# Patient Record
Sex: Male | Born: 1980 | Race: White | Hispanic: No | Marital: Married | Smoking: Current some day smoker
Health system: Southern US, Community
[De-identification: ages and names within clinical notes are randomized; demographics above are authoritative.]

## PROBLEM LIST (undated history)

## (undated) DIAGNOSIS — R569 Unspecified convulsions: Secondary | ICD-10-CM

## (undated) HISTORY — PX: TONSILLECTOMY: SUR1361

## (undated) HISTORY — PX: CHOLECYSTECTOMY: SHX55

---

## 2019-04-01 ENCOUNTER — Other Ambulatory Visit: Payer: Self-pay | Admitting: Neurology

## 2019-04-01 DIAGNOSIS — R569 Unspecified convulsions: Secondary | ICD-10-CM

## 2019-04-04 ENCOUNTER — Other Ambulatory Visit: Payer: Self-pay

## 2019-04-04 ENCOUNTER — Emergency Department
Admission: EM | Admit: 2019-04-04 | Discharge: 2019-04-04 | Disposition: A | Discharge status: Home / Self Care | Payer: 59 | Attending: Emergency Medicine | Admitting: Emergency Medicine

## 2019-04-04 ENCOUNTER — Emergency Department: Payer: 59

## 2019-04-04 DIAGNOSIS — F17210 Nicotine dependence, cigarettes, uncomplicated: Secondary | ICD-10-CM | POA: Insufficient documentation

## 2019-04-04 DIAGNOSIS — R569 Unspecified convulsions: Secondary | ICD-10-CM | POA: Diagnosis present

## 2019-04-04 HISTORY — DX: Unspecified convulsions: R56.9

## 2019-04-04 LAB — CBC
HCT: 44.5 % (ref 39.0–52.0)
Hemoglobin: 14.9 g/dL (ref 13.0–17.0)
MCH: 28.5 pg (ref 26.0–34.0)
MCHC: 33.5 g/dL (ref 30.0–36.0)
MCV: 85.2 fL (ref 80.0–100.0)
Platelets: 253 10*3/uL (ref 150–400)
RBC: 5.22 MIL/uL (ref 4.22–5.81)
RDW: 13.2 % (ref 11.5–15.5)
WBC: 8.7 10*3/uL (ref 4.0–10.5)
nRBC: 0 % (ref 0.0–0.2)

## 2019-04-04 LAB — COMPREHENSIVE METABOLIC PANEL
ALT: 24 U/L (ref 0–44)
AST: 25 U/L (ref 15–41)
Albumin: 4.5 g/dL (ref 3.5–5.0)
Alkaline Phosphatase: 50 U/L (ref 38–126)
Anion gap: 16 — ABNORMAL HIGH (ref 5–15)
BUN: 14 mg/dL (ref 6–20)
CO2: 17 mmol/L — ABNORMAL LOW (ref 22–32)
Calcium: 9.1 mg/dL (ref 8.9–10.3)
Chloride: 105 mmol/L (ref 98–111)
Creatinine, Ser: 1.29 mg/dL — ABNORMAL HIGH (ref 0.61–1.24)
GFR calc Af Amer: >60 mL/min (ref >60)
GFR calc non Af Amer: >60 mL/min (ref >60)
Glucose, Bld: 121 mg/dL — ABNORMAL HIGH (ref 70–99)
Potassium: 3.8 mmol/L (ref 3.5–5.1)
Sodium: 138 mmol/L (ref 135–145)
Total Bilirubin: 0.7 mg/dL (ref 0.3–1.2)
Total Protein: 7.4 g/dL (ref 6.5–8.1)

## 2019-04-04 LAB — GLUCOSE, CAPILLARY: Glucose-Capillary: 108 mg/dL — ABNORMAL HIGH (ref 70–99)

## 2019-04-04 NOTE — ED Notes (Signed)
Pt's wife concerns relayed to Dr Corky Downs, no new orders at this time

## 2019-04-04 NOTE — ED Notes (Signed)
Seizure pads in place

## 2019-04-04 NOTE — ED Notes (Signed)
Dr Kinner at bedside. 

## 2019-04-04 NOTE — ED Notes (Signed)
Wife at bedside.

## 2019-04-04 NOTE — ED Triage Notes (Signed)
Pt arrives via EMS after having a possible seizure at work- pt has a hx of seizures and was taking keppra but was weaned off and started topamax- pt hit his head on a desk with noticeable swelling to the right eyebrow- pt is A&O x4- per EMS initial CBG was 26, after oral glucose cbg came up to 126- last cbg after 330ml NS was 114

## 2019-04-04 NOTE — ED Provider Notes (Addendum)
[facility] Emergency Department Provider Note   ____________________________________________    I have reviewed the triage vital signs and the nursing notes.   HISTORY  Chief Complaint Seizures     HPI Todd James is a 38 y.o. male with a history of seizures who presents after having a seizure.  Patient reports that he is recently began transitioning from [place] to Topamax under the guidance of his neurologist Dr. Manuella Ghazi.  He states he is only at half dose Topamax currently.  Today while at work he apparently had a seizure and fell and struck his face against a table or 4, unclear.  Did not lose continence.  Did not bite his tongue this time.  Currently feels well and has no complaints.  Past Medical History:  Diagnosis Date  . Seizures ([place])     There are no active problems to display for this patient.   Past Surgical History:  Procedure Laterality Date  . TONSILLECTOMY      Prior to Admission medications   Not on File     Allergies Patient has no known allergies.  No family history on file.  Social History Social History   Tobacco Use  . Smoking status: Current Some Day Smoker  . Smokeless tobacco: Never Used  Substance Use Topics  . Alcohol use: Not Currently  . Drug use: Not on file    Review of Systems  Constitutional: No fever/chills Eyes: No visual changes.  Swelling above the right eye ENT: No neck pain Cardiovascular: Denies chest pain. Respiratory: Denies shortness of breath. Gastrointestinal: No abdominal pain.  No nausea, no vomiting.   Genitourinary: Negative for dysuria. Musculoskeletal: Negative for back pain. Skin: Bruising to the right face Neurological: Negative for headaches or weakness   ____________________________________________   PHYSICAL EXAM:  VITAL SIGNS: ED Triage Vitals  Enc Vitals Group     BP 04/04/19 1307 (!) 147/89     Pulse Rate 04/04/19 1307 (!) 114     Resp 04/04/19 1314 20    Temp 04/04/19 1314 98.4 F (36.9 C)     Temp Source 04/04/19 1314 Oral     SpO2 04/04/19 1307 97 %     Weight 04/04/19 1309 106.1 kg (234 lb)     Height 04/04/19 1309 1.829 m (6')     Head Circumference --      Peak Flow --      Pain Score 04/04/19 1308 2     Pain Loc --      Pain Edu? --      Excl. in [place]? --     Constitutional: Alert and oriented.  Eyes: Conjunctivae are normal.  Head: Swelling ecchymosis to the right eyelid Nose: No swelling or epistaxis Mouth/Throat: Mucous membranes are moist.   Neck:  Painless ROM, no vertebral tenderness palpation Cardiovascular: Normal rate, regular rhythm. Grossly normal heart sounds.  Good peripheral circulation.  No chest wall tenderness palpation Respiratory: Normal respiratory effort.  No retractions. Lungs CTAB. Gastrointestinal: Soft and nontender. No distention Musculoskeletal:  Warm and well perfused Neurologic:  Normal speech and language. No gross focal neurologic deficits are appreciated.  Skin:  Skin is warm, dry and intact. No rash noted. Psychiatric: Mood and affect are normal. Speech and behavior are normal.  ____________________________________________   LABS (all labs ordered are listed, but only abnormal results are displayed)  Labs Reviewed  COMPREHENSIVE METABOLIC PANEL - Abnormal; Notable for the following components:      Result Value  CO2 17 (*)    Glucose, Bld 121 (*)    Creatinine, Ser 1.29 (*)    Anion gap 16 (*)    All other components within normal limits  GLUCOSE, CAPILLARY - Abnormal; Notable for the following components:   Glucose-Capillary 108 (*)    All other components within normal limits  CBC  CBG MONITORING, ED   ____________________________________________  EKG  ED ECG REPORT I, Jene Every, the attending physician, personally viewed and interpreted this ECG.  Date: 04/04/2019 EKG Time: Sinus tachycardia  Rhythm: normal sinus rhythm QRS Axis: normal Intervals: normal ST/T  Wave abnormalities: normal Narrative Interpretation: no evidence of acute ischemia  ____________________________________________  RADIOLOGY  CT head and max face ____________________________________________   PROCEDURES  Procedure(s) performed: No  Procedures   Critical Care performed: No ____________________________________________   INITIAL IMPRESSION / ASSESSMENT AND PLAN / ED COURSE  Pertinent labs & imaging results that were available during my care of the patient were reviewed by me and considered in my medical decision making (see chart for details).  Patient with a history of seizures presents after having a seizure.  He states that he has had possibly 2 or 3 seizures this year.  Is currently transitioning from Keppra to Topamax, this is likely the cause of the seizure.  Swelling and bruising to the right orbit, otherwise reassuring exam.  Pending imaging, labs.  Patient's lab work is reassuring, CT imaging is unremarkable.  Patient observed in the emergency department greater than 2 hours, no further seizure activity.  Recommend close follow-up cultures,, he has EEG and MRI scheduled as an outpatient.  His wife is frustrated that these tests are not being done here in the emergency department but I emphasized that it would be unlikely to change management in the short-term and that he already has scheduled as an outpatient.  Patient is quite comfortable with this plan and is ready to go home.  Emphasized that the patient cannot drive until cleared by neurology    ____________________________________________   FINAL CLINICAL IMPRESSION(S) / ED DIAGNOSES  Final diagnoses:  Seizure (HCC)        Note:  This document was prepared using Dragon voice recognition software and may include unintentional dictation errors.   Jene Every, MD 04/04/19 1529    Jene Every, MD 04/04/19 [phone]

## 2019-04-04 NOTE — ED Notes (Signed)
Pt up to use bathroom 

## 2019-04-04 NOTE — ED Notes (Signed)
Patient transported to CT 

## 2019-04-09 ENCOUNTER — Ambulatory Visit
Admission: RE | Admit: 2019-04-09 | Discharge: 2019-04-09 | Disposition: A | Discharge status: Home / Self Care | Payer: 59 | Source: Ambulatory Visit | Attending: Neurology | Admitting: Neurology

## 2019-04-09 ENCOUNTER — Other Ambulatory Visit: Payer: Self-pay

## 2019-04-09 DIAGNOSIS — R569 Unspecified convulsions: Secondary | ICD-10-CM | POA: Diagnosis present

## 2019-04-09 MED ORDER — GADOBUTROL 1 MMOL/ML IV SOLN
10.0000 mL | Freq: Once | INTRAVENOUS | Status: AC | PRN
  Administered 2019-04-09: 10 mL via INTRAVENOUS

## 2019-04-13 ENCOUNTER — Ambulatory Visit: Payer: PRIVATE HEALTH INSURANCE

## 2020-07-19 ENCOUNTER — Ambulatory Visit: Payer: 59 | Admitting: Urology

## 2020-07-27 ENCOUNTER — Ambulatory Visit: Payer: 59 | Admitting: Urology

## 2020-07-27 ENCOUNTER — Encounter: Payer: Self-pay | Admitting: Urology

## 2020-07-27 ENCOUNTER — Other Ambulatory Visit: Payer: Self-pay

## 2020-07-27 VITALS — BP 126/81 | HR 87 | Ht 72.0 in | Wt 226.0 lb

## 2020-07-27 DIAGNOSIS — Z3009 Encounter for other general counseling and advice on contraception: Secondary | ICD-10-CM | POA: Diagnosis not present

## 2020-07-27 MED ORDER — DIAZEPAM 10 MG PO TABS: 10.0000 mg | ORAL_TABLET | Freq: Once | ORAL | 0 refills | Status: AC

## 2020-07-27 NOTE — Progress Notes (Signed)
07/27/2020 8:49 AM   Osie Bond Feb 05, 1981 [id number]  Referring provider: Care, [facility] [address] [place],  [place] [address]  Chief Complaint  Patient presents with  . VAS Consult    HPI: 40 y.o. year old male referred for further evaluation of possible vasectomy.  Denies a history of testicular trauma or pain.  No urinary issues.  No previous scrotal surgeries.  He has 6 children.  He desires no further biological children.   PMH: Past Medical History:  Diagnosis Date  . Seizures [facility])     Surgical History: Past Surgical History:  Procedure Laterality Date  . TONSILLECTOMY      Home Medications:  Allergies as of 07/27/2020   No Known Allergies     Medication List       Accurate as of July 27, 2020  8:49 AM. If you have any questions, ask your nurse or doctor.        topiramate 50 MG tablet Commonly known as: TOPAMAX Take by mouth.       Allergies: No Known Allergies  Family History: No family history on file.  Social History:  reports that he has been smoking. He has never used smokeless tobacco. He reports previous alcohol use. No history on file for drug use.  Physical Exam: BP 126/81   Pulse 87   Ht 6' (1.829 m)   Wt 226 lb (102.5 kg)   BMI 30.65 kg/m   Constitutional:  Alert and oriented, No acute distress. HEENT: [place] AT, moist mucus membranes.  Trachea midline, no masses. Cardiovascular: No clubbing, cyanosis, or edema. Respiratory: Normal respiratory effort, no increased work of breathing. GI: Abdomen is soft, nontender, nondistended, no abdominal masses GU: Normal phallus.  Bilateral descended testicles without masses.  Vasa easily palpable bilaterally. Skin: No rashes, bruises or suspicious lesions. Neurologic: Grossly intact, no focal deficits, moving all 4 extremities. Psychiatric: Normal mood and affect.  Laboratory Data: N/a  Urinalysis n/a  Pertinent Imaging: N/a  Assessment & Plan:    1. Vasectomy  evaluation Today, we discussed what the vas deferens is, where it is located, and its function. We reviewed the procedure for vasectomy, it's risks, benefits, alternatives, and likelihood of achieving his goals. We discussed in detail the procedure, complications, and recovery as well as the need for clearance prior to unprotected intercourse. We discussed that vasectomy does not protect against sexually transmitted diseases. We discussed that this procedure does not result in immediate sterility and that they would need to use other forms of birth control until he has been cleared with negative postvasectomy semen analyses. I explained that the procedure is considered to be permanent and that attempts at reversal have varying degrees of success. These options include vasectomy reversal, sperm retrieval, and in vitro fertilization; these can be very expensive. We discussed the chance of postvasectomy pain syndrome which occurs in less than 5% of patients. I explained to the patient that there is no treatment to resolve this chronic pain, and that if it developed I would not be able to help resolve the issue, but that surgery is generally not needed for correction. I explained there have even been reports of systemic like illness associated with this chronic pain, and that there was no good cure. I explained that vasectomy it is not a 100% reliable form of birth control, and the risk of pregnancy after vasectomy is approximately 1 in 2000 men who had a negative postvasectomy semen analysis or rare non-motile sperm. I  explained that repeat vasectomy was necessary in less than 1% of vasectomy procedures when employing the type of technique that I use. I explained that he should refrain from ejaculation for approximately one week following vasectomy. I explained that there are other options for birth control which are permanent and non-permanent; we discussed these. I explained the rates of surgical complications, such  as symptomatic hematoma or infection, are low (1-2%) and vary with the surgeon's experience and criteria used to diagnose the complication.   The patient had the opportunity to ask questions to his stated satisfaction. He voiced understanding of the above factors and stated that he has read all the information provided to him and the packets and informed consent.  He is interested in receiving of Valium 10 mg prior to the procedure for the purpose of anxiolysis.  A prescription was given today.  He will have a driver on the day of the procedure.    Vanna Scotland, MD  [facility] [address], [address] [place], [place] [address] [phone]

## 2020-07-27 NOTE — Patient Instructions (Signed)

## 2020-08-24 ENCOUNTER — Other Ambulatory Visit: Payer: Self-pay

## 2020-08-24 ENCOUNTER — Ambulatory Visit: Payer: 59 | Admitting: Urology

## 2020-08-24 VITALS — BP 124/80 | HR 109 | Ht 72.0 in | Wt 226.0 lb

## 2020-08-24 DIAGNOSIS — Z3009 Encounter for other general counseling and advice on contraception: Secondary | ICD-10-CM

## 2020-08-24 NOTE — Patient Instructions (Signed)

## 2020-08-24 NOTE — Progress Notes (Signed)
08/24/20  CC:  Chief Complaint  Patient presents with  . VAS    HPI: 40 year old male who presents today for vasectomy.  Consent was confirmed.  All additional questions were answered.  Blood pressure 124/80, pulse (!) 109, height 6' (1.829 m), weight 226 lb (102.5 kg). NED. A&Ox3.   No respiratory distress   Abd soft, NT, ND Normal external genitalia with patent urethral meatus   Bilateral Vasectomy Procedure  Pre-Procedure: - Patient's scrotum was prepped and draped for vasectomy. - The vas was palpated through the scrotal skin on the left. - 1% Xylocaine was injected into the skin and surrounding tissue for placement  - In a similar manner, the vas on the right was identified, anesthetized, and stabilized.  Procedure: - A #11 blade was used to make a small stab incision in the skin overlying the vas - The left vas was isolated and brought up through the incision exposing that structure. - Bleeding points were cauterized as they occurred. - The vas was free from the surrounding structures and brought to the view. - A segment was positioned for placement with a hemostat. - A second hemostat was placed and a small segment between the two hemostats and was removed for inspection. - Each end of the transected vas lumen was fulgurated/ obliterated using needlepoint electrocautery -A fascial interposition was performed on testicular end of the vas using #3-0 chromic suture -The same procedure was performed on the right. - A single suture of #3-0 chromic catgut was used to close each lateral scrotal skin incision - A dressing was applied.  Post-Procedure: - Patient was instructed in care of the operative area - A specimen is to be delivered in 12 weeks   -Another form of contraception is to be used until post vasectomy semen analysis  Vanna Scotland, MD

## 2020-10-21 IMAGING — CT CT HEAD W/O CM
3 series · 15 of 47 positions shown, 18 images · non-contrast
Comparison: None.

CLINICAL DATA: Possible seizure, facial injury.

EXAM:
CT HEAD WITHOUT CONTRAST
CT MAXILLOFACIAL WITHOUT CONTRAST
TECHNIQUE: Multidetector CT imaging of the head and maxillofacial structures
were performed using the standard protocol without intravenous
contrast. Multiplanar CT image reconstructions of the maxillofacial
structures were also generated.

[Series 3: head wo · axial · 0.47mm/px · z∈[-89,+36]mm · 9 of 30 slices shown, 12 images]
[im 3/30  brain]
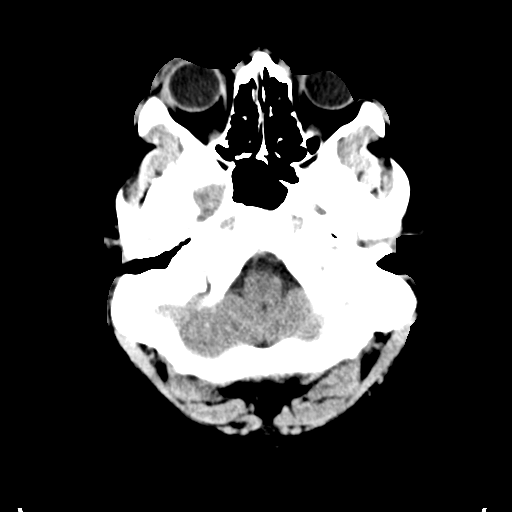
[im 3/30  bone]
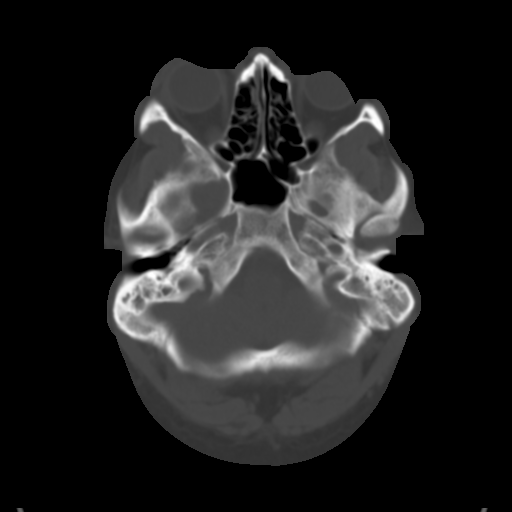
[im 6/30  brain]
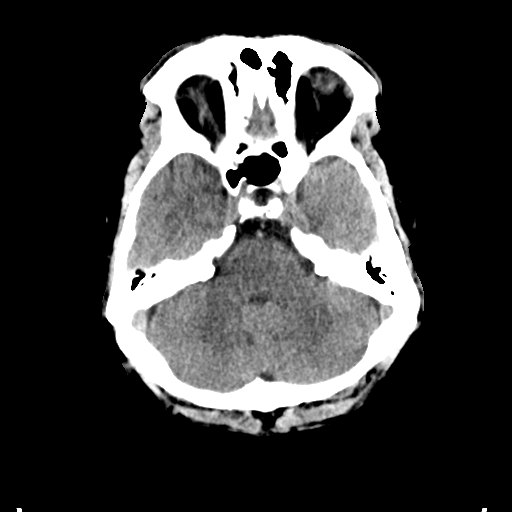
[im 9/30  brain]
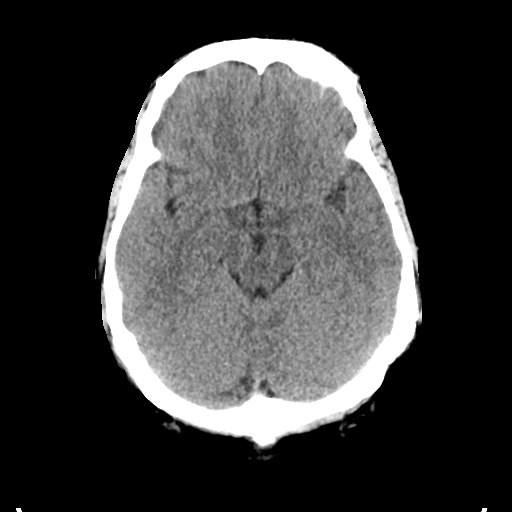
[im 12/30  brain]
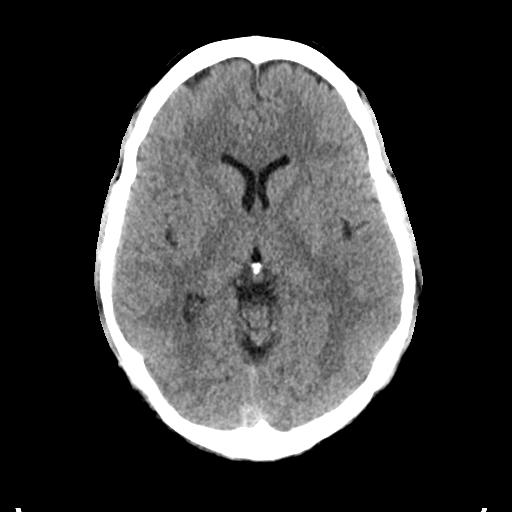
[im 16/30  brain]
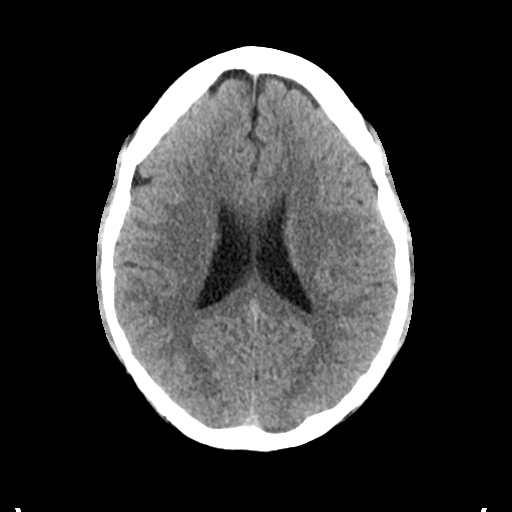
[im 16/30  bone]
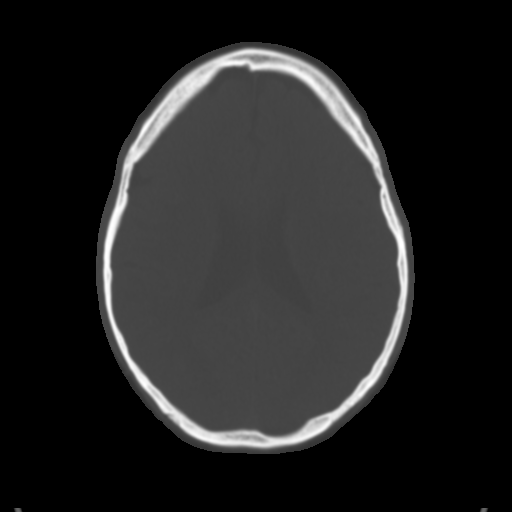
[im 19/30  brain]
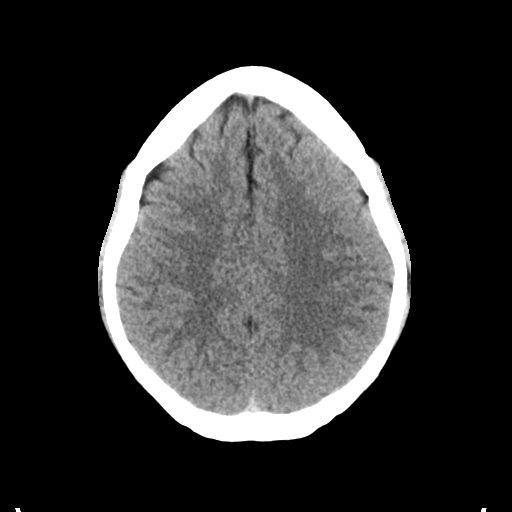
[im 22/30  brain]
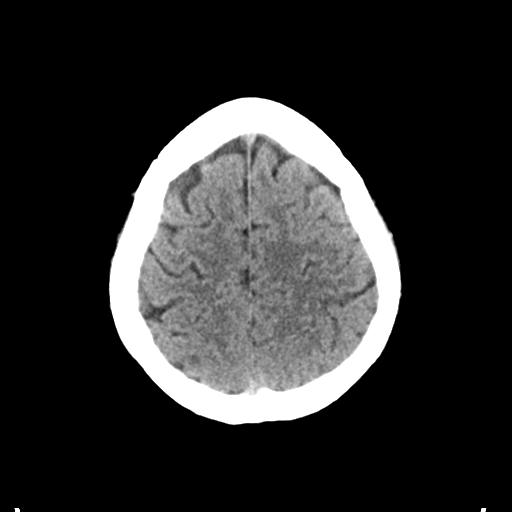
[im 25/30  brain]
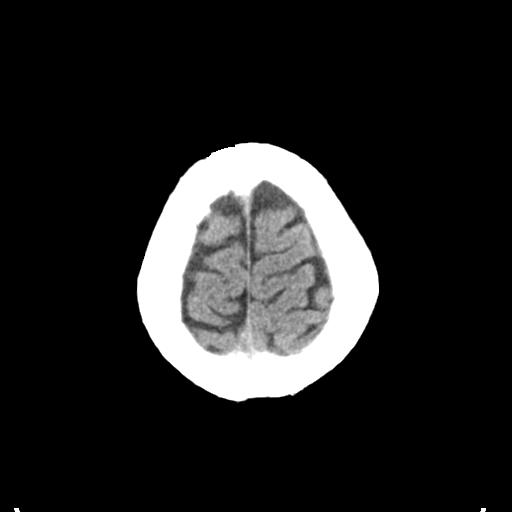
[im 28/30  brain]
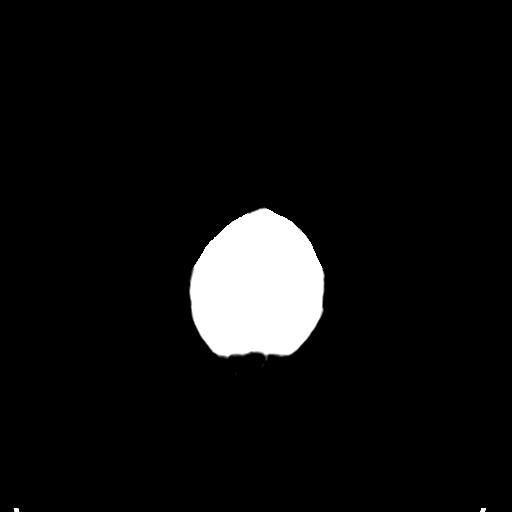
[im 28/30  bone]
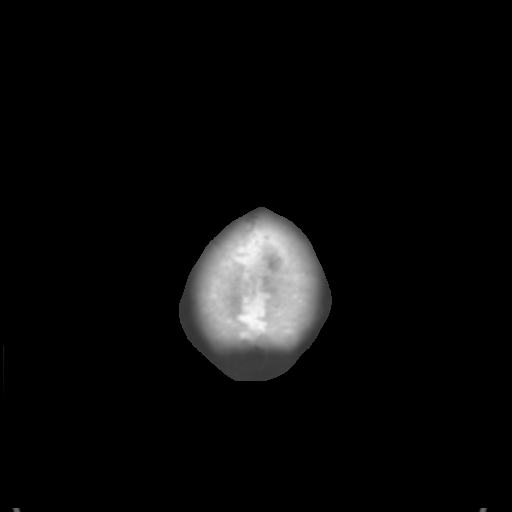

[Series 4: coronal soft tissue · coronal · 0.29mm/px · 3 of 74 slices shown]
[im 25/74  brain]
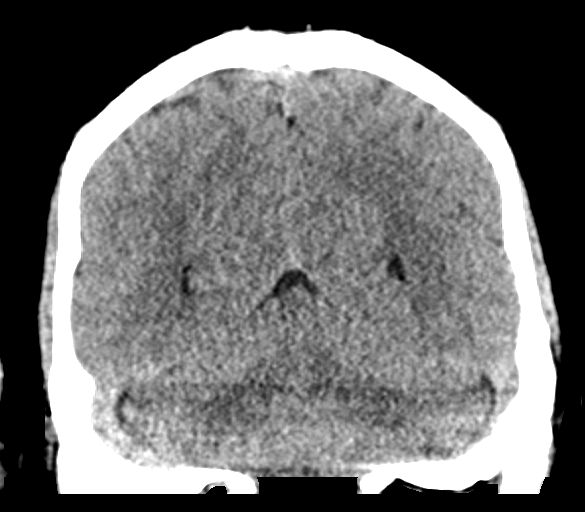
[im 33/74  brain]
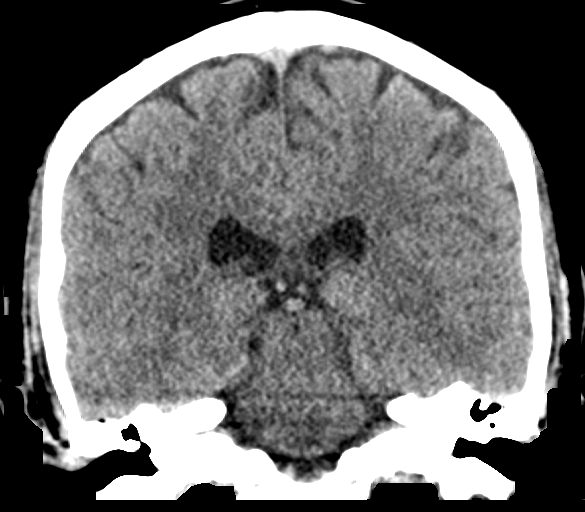
[im 41/74  brain]
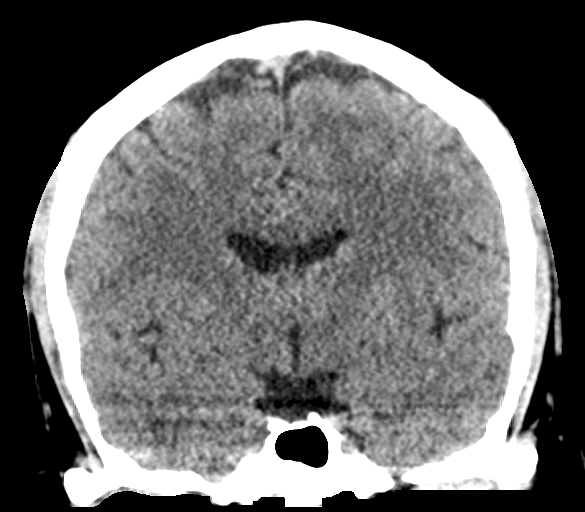

[Series 5: sagittal soft tissue · sagittal · 0.29mm/px · 3 of 57 slices shown]
[im 19/57  brain]
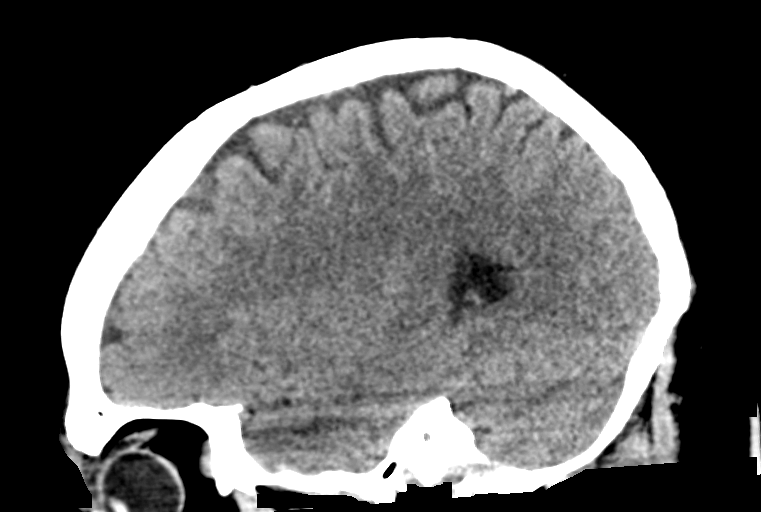
[im 29/57  brain]
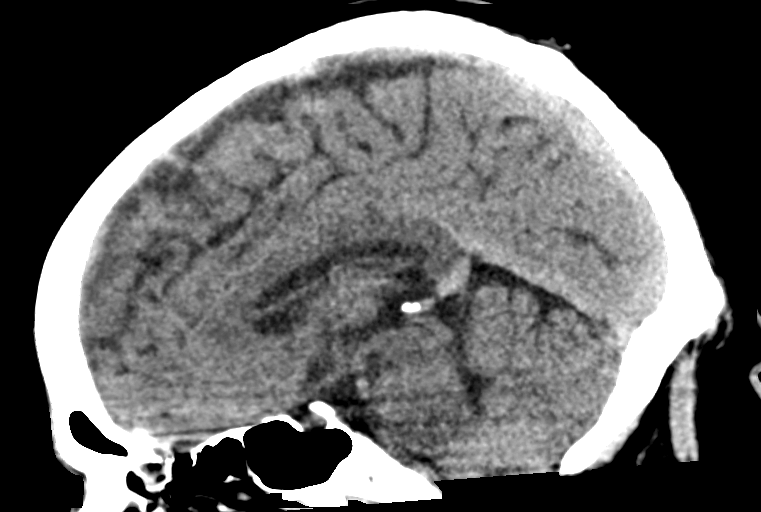
[im 38/57  brain]
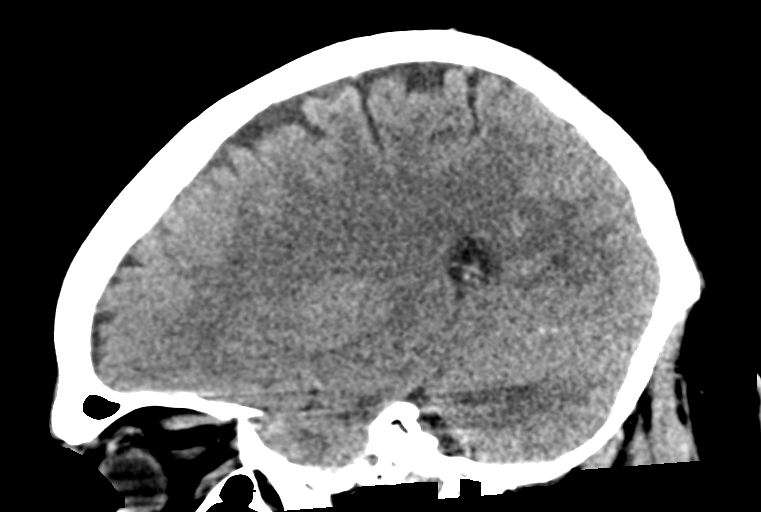

[15 of 47 positions shown; findings below may reference images not displayed]

FINDINGS: CT HEAD FINDINGS

Brain: No evidence of acute infarction, hemorrhage, hydrocephalus,
extra-axial collection or mass lesion/mass effect.

Vascular: No hyperdense vessel or unexpected calcification.

Skull: Normal. Negative for fracture or focal lesion.

Other: None.

CT MAXILLOFACIAL FINDINGS

Osseous: No fracture or mandibular dislocation. No destructive
process.

Orbits: Negative. No traumatic or inflammatory finding.

Sinuses: Clear.

Soft tissues: Contusion is seen overlying right supraorbital rim.
IMPRESSION: Normal head CT.

Soft tissue contusion overlying right supraorbital rim. No fracture
or osseous abnormality is noted.

## 2020-11-24 ENCOUNTER — Other Ambulatory Visit: Payer: 59

## 2020-11-29 ENCOUNTER — Other Ambulatory Visit: Payer: 59

## 2020-12-02 ENCOUNTER — Encounter: Payer: Self-pay | Admitting: Urology

## 2022-09-10 ENCOUNTER — Ambulatory Visit
Admission: EM | Admit: 2022-09-10 | Discharge: 2022-09-10 | Disposition: A | Discharge status: Home / Self Care | Payer: BC Managed Care – PPO | Attending: Internal Medicine | Admitting: Internal Medicine

## 2022-09-10 ENCOUNTER — Ambulatory Visit (HOSPITAL_BASED_OUTPATIENT_CLINIC_OR_DEPARTMENT_OTHER)
Admission: RE | Admit: 2022-09-10 | Discharge: 2022-09-10 | Disposition: A | Discharge status: Home / Self Care | Payer: BC Managed Care – PPO | Source: Ambulatory Visit | Attending: Nurse Practitioner | Admitting: Nurse Practitioner

## 2022-09-10 ENCOUNTER — Telehealth: Payer: Self-pay

## 2022-09-10 DIAGNOSIS — Z1152 Encounter for screening for COVID-19: Secondary | ICD-10-CM | POA: Diagnosis not present

## 2022-09-10 DIAGNOSIS — R509 Fever, unspecified: Secondary | ICD-10-CM | POA: Diagnosis present

## 2022-09-10 DIAGNOSIS — R051 Acute cough: Secondary | ICD-10-CM | POA: Diagnosis present

## 2022-09-10 DIAGNOSIS — B349 Viral infection, unspecified: Secondary | ICD-10-CM

## 2022-09-10 LAB — POCT INFLUENZA A/B
Influenza A, POC: NEGATIVE
Influenza B, POC: NEGATIVE

## 2022-09-10 MED ORDER — BENZONATATE 200 MG PO CAPS: 200.0000 mg | ORAL_CAPSULE | Freq: Three times a day (TID) | ORAL | 0 refills | Status: AC | PRN

## 2022-09-10 MED ORDER — ACETAMINOPHEN 325 MG PO TABS
650.0000 mg | ORAL_TABLET | Freq: Once | ORAL | Status: AC
  Administered 2022-09-10: 650 mg via ORAL

## 2022-09-10 NOTE — Telephone Encounter (Signed)
Called pt to let him know about the chest x-ray results and about the rx for tessalon that was sent to the pharmacy.  Pt expressed understanding.

## 2022-09-10 NOTE — ED Provider Notes (Signed)
[facility]    CSN: [id number] Arrival date & time: 09/10/22  0903      History   Chief Complaint Chief Complaint  Patient presents with   Cough    Fever, congestion, body soreness, headache, fatigue. - Entered by patient   Fever   Generalized Body Aches   Chills   Headache    HPI Todd James is a 42 y.o. male  presents for evaluation of URI symptoms for 1 days.  Patient states a week ago he had low-grade fevers, cough, and congestion that seem to be improving.  He still had a lingering cough.  Yesterday he developed symptoms of cough, fever, headache, body aches, congestion. Denies N/V/D, shortness of breath, ear pain, sore throat. Patient does not have a hx of asthma.  He is a previous smoker and currently vapes.  States he has been around someone with similar symptoms.  Pt has taken DayQuil OTC for symptoms. Pt has no other concerns at this time.    Cough Associated symptoms: fever, headaches and myalgias   Fever Associated symptoms: congestion, cough, headaches and myalgias   Headache Associated symptoms: congestion, cough, fever and myalgias     Past Medical History:  Diagnosis Date   Seizures ([place])     There are no problems to display for this patient.   Past Surgical History:  Procedure Laterality Date   CHOLECYSTECTOMY     TONSILLECTOMY         Home Medications    Prior to Admission medications   Medication Sig Start Date End Date Taking? Authorizing Provider  benzonatate (TESSALON) 200 MG capsule Take 1 capsule (200 mg total) by mouth 3 (three) times daily as needed for cough. 09/10/22  Yes Melynda Ripple, NP  topiramate (TOPAMAX) 50 MG tablet Take by mouth. 03/25/19 04/18/21  [provider]    Family History History reviewed. No pertinent family history.  Social History Social History   Tobacco Use   Smoking status: Some Days   Smokeless tobacco: Never  Vaping Use   Vaping Use: Every day  Substance Use Topics   Alcohol  use: Not Currently   Drug use: Never     Allergies   Levetiracetam   Review of Systems Review of Systems  Constitutional:  Positive for fever.  HENT:  Positive for congestion.   Respiratory:  Positive for cough.   Musculoskeletal:  Positive for myalgias.  Neurological:  Positive for headaches.     Physical Exam Triage Vital Signs ED Triage Vitals  Enc Vitals Group     BP 09/10/22 0944 132/81     Pulse Rate 09/10/22 0944 (!) 117     Resp 09/10/22 0944 20     Temp 09/10/22 0944 (!) 102.8 F (39.3 C)     Temp Source 09/10/22 0944 Oral     SpO2 09/10/22 0944 98 %     Weight --      Height --      Head Circumference --      Peak Flow --      Pain Score 09/10/22 0942 6     Pain Loc --      Pain Edu? --      Excl. in [place]? --    No data found.  Updated Vital Signs BP 132/81 (BP Location: Right Arm)   Pulse (!) 117   Temp (!) 102.8 F (39.3 C) (Oral)   Resp 20   SpO2 98%   Visual Acuity Right Eye  Distance:   Left Eye Distance:   Bilateral Distance:    Right Eye Near:   Left Eye Near:    Bilateral Near:     Physical Exam Vitals and nursing note reviewed.  Constitutional:      General: He is not in acute distress.    Appearance: Normal appearance. He is not ill-appearing or toxic-appearing.  HENT:     Head: Normocephalic and atraumatic.     Right Ear: Tympanic membrane and ear canal normal.     Left Ear: Tympanic membrane and ear canal normal.     Nose: Congestion present.     Mouth/Throat:     Mouth: Mucous membranes are moist.     Pharynx: No oropharyngeal exudate or posterior oropharyngeal erythema.  Eyes:     Pupils: Pupils are equal, round, and reactive to light.  Cardiovascular:     Rate and Rhythm: Normal rate and regular rhythm.     Heart sounds: Normal heart sounds.  Pulmonary:     Effort: Pulmonary effort is normal.     Breath sounds: Normal breath sounds.  Musculoskeletal:     Cervical back: Normal range of motion and neck supple.   Lymphadenopathy:     Cervical: No cervical adenopathy.  Skin:    General: Skin is warm and dry.  Neurological:     General: No focal deficit present.     Mental Status: He is alert and oriented to person, place, and time.  Psychiatric:        Mood and Affect: Mood normal.        Behavior: Behavior normal.      UC Treatments / Results  Labs (all labs ordered are listed, but only abnormal results are displayed) Labs Reviewed  SARS CORONAVIRUS 2 (TAT 6-24 HRS)  POCT INFLUENZA A/B    EKG   Radiology No results found.  Procedures Procedures (including critical care time)  Medications Ordered in UC Medications  acetaminophen (TYLENOL) tablet 650 mg (650 mg Oral Given 09/10/22 0947)    Initial Impression / Assessment and Plan / UC Course  I have reviewed the triage vital signs and the nursing notes.  Pertinent labs & imaging results that were available during my care of the patient were reviewed by me and considered in my medical decision making (see chart for details).     Negative rapid flu in clinic.  COVID PCR and will contact if positive Given 1 week of symptoms with new onset fever we will do chest x-ray to rule out pneumonia.  No x-ray tech available onsite today.  Patient will go to [facility] to have this done and I will contact him with those results Tessalon as needed for cough Patient was given Tylenol in clinic for fever.  He will continue OTC Tylenol or ibuprofen as needed for fever management Rest and fluids PCP follow-up 2 days for recheck ER precautions reviewed and patient verbalized understanding Final Clinical Impressions(s) / UC Diagnoses   Final diagnoses:  Fever, unspecified  Viral illness  Acute cough     Discharge Instructions      Tessalon as needed for cough Clinical contact you with result of your COVID test is positive I will contact you with the results of your chest x-ray done today once I have those results Rest and  fluids Over-the-counter Tylenol or ibuprofen as needed for fever management Follow-up with your PCP if symptoms do not improve Please go to the ER for any worsening symptoms  ED Prescriptions     Medication Sig Dispense Auth. Provider   benzonatate (TESSALON) 200 MG capsule Take 1 capsule (200 mg total) by mouth 3 (three) times daily as needed for cough. 20 capsule Melynda Ripple, NP      PDMP not reviewed this encounter.   Melynda Ripple, NP 09/10/22 1015

## 2022-09-10 NOTE — Discharge Instructions (Signed)
Tessalon as needed for cough Clinical contact you with result of your COVID test is positive I will contact you with the results of your chest x-ray done today once I have those results Rest and fluids Over-the-counter Tylenol or ibuprofen as needed for fever management Follow-up with your PCP if symptoms do not improve Please go to the ER for any worsening symptoms

## 2022-09-10 NOTE — ED Triage Notes (Signed)
Pt c/o cough, fever, body aches, headache and congestion.   Home interventions: dayquil  Started: last night

## 2022-09-11 LAB — SARS CORONAVIRUS 2 (TAT 6-24 HRS): SARS Coronavirus 2: NEGATIVE

## 2023-01-31 ENCOUNTER — Emergency Department (HOSPITAL_BASED_OUTPATIENT_CLINIC_OR_DEPARTMENT_OTHER)
Admission: EM | Admit: 2023-01-31 | Discharge: 2023-01-31 | Disposition: A | Discharge status: Home / Self Care | Payer: BC Managed Care – PPO | Attending: Emergency Medicine | Admitting: Emergency Medicine

## 2023-01-31 ENCOUNTER — Emergency Department (HOSPITAL_BASED_OUTPATIENT_CLINIC_OR_DEPARTMENT_OTHER): Payer: BC Managed Care – PPO

## 2023-01-31 ENCOUNTER — Other Ambulatory Visit: Payer: Self-pay

## 2023-01-31 ENCOUNTER — Encounter (HOSPITAL_BASED_OUTPATIENT_CLINIC_OR_DEPARTMENT_OTHER): Payer: Self-pay | Admitting: Emergency Medicine

## 2023-01-31 DIAGNOSIS — R0789 Other chest pain: Secondary | ICD-10-CM | POA: Insufficient documentation

## 2023-01-31 LAB — COMPREHENSIVE METABOLIC PANEL
ALT: 18 U/L (ref 0–44)
AST: 20 U/L (ref 15–41)
Albumin: 4.8 g/dL (ref 3.5–5.0)
Alkaline Phosphatase: 45 U/L (ref 38–126)
Anion gap: 11 (ref 5–15)
BUN: 13 mg/dL (ref 6–20)
CO2: 25 mmol/L (ref 22–32)
Calcium: 9.9 mg/dL (ref 8.9–10.3)
Chloride: 103 mmol/L (ref 98–111)
Creatinine, Ser: 1.24 mg/dL (ref 0.61–1.24)
GFR, Estimated: >60 mL/min (ref >60)
Glucose, Bld: 98 mg/dL (ref 70–99)
Potassium: 3.9 mmol/L (ref 3.5–5.1)
Sodium: 139 mmol/L (ref 135–145)
Total Bilirubin: 0.6 mg/dL (ref 0.3–1.2)
Total Protein: 7.7 g/dL (ref 6.5–8.1)

## 2023-01-31 LAB — CBC
HCT: 44 % (ref 39.0–52.0)
Hemoglobin: 15.5 g/dL (ref 13.0–17.0)
MCH: 30.1 pg (ref 26.0–34.0)
MCHC: 35.2 g/dL (ref 30.0–36.0)
MCV: 85.4 fL (ref 80.0–100.0)
Platelets: 229 10*3/uL (ref 150–400)
RBC: 5.15 MIL/uL (ref 4.22–5.81)
RDW: 13.3 % (ref 11.5–15.5)
WBC: 6.7 10*3/uL (ref 4.0–10.5)
nRBC: 0 % (ref 0.0–0.2)

## 2023-01-31 LAB — TROPONIN I (HIGH SENSITIVITY)
Troponin I (High Sensitivity): <2 ng/L (ref <18)
Troponin I (High Sensitivity): 2 ng/L (ref <18)

## 2023-01-31 LAB — LIPASE, BLOOD: Lipase: 28 U/L (ref 11–51)

## 2023-01-31 NOTE — ED Triage Notes (Signed)
Squeezing  mid xiphoidal chest pain , that took his breath,  started ago no n/v/and states feels hot

## 2023-01-31 NOTE — ED Provider Notes (Signed)
[facility] Provider Note   CSN: [id number] Arrival date & time: 01/31/23  1238     History  Chief Complaint  Patient presents with   Chest Pain    Todd James is a 42 y.o. male.  With past medical history of seizures who presents to the emergency department with chest pain.  States he was at work today when he began having chest pain.  States that he was seated not exerting himself.  He began to have central chest pain that he describes as a squeezing.  This pain got progressively worse until he decided to go home.  He endorsed some mild shortness of breath when the pain was severe.  He felt hot all over when the pain began.  He denies having any palpitations, syncope.  Denies radiation of pain into his arm, jaw, back or abdomen.  Denies having any nausea with the pain.  He states that the pain in his diminished since onset however still present.  Denies any known heart issues.  Does not take any medications.  No recent cough or fever.  No recent long flights or drives, immobilization, history of DVT or PE.  He does vape.   Chest Pain      Home Medications Prior to Admission medications   Medication Sig Start Date End Date Taking? Authorizing Provider  benzonatate (TESSALON) 200 MG capsule Take 1 capsule (200 mg total) by mouth 3 (three) times daily as needed for cough. 09/10/22   Radford Pax, NP  topiramate (TOPAMAX) 50 MG tablet Take by mouth. 03/25/19 04/18/21  [provider]      Allergies    Levetiracetam    Review of Systems   Review of Systems  Cardiovascular:  Positive for chest pain.  All other systems reviewed and are negative.   Physical Exam Updated Vital Signs BP 114/85 (BP Location: Right Arm)   Pulse (!) 56   Temp 97.7 F (36.5 C) (Oral)   Resp 19   Ht 5\' 11"  (1.803 m)   Wt 90.4 kg   SpO2 98%   BMI 27.80 kg/m  Physical Exam Vitals and nursing note reviewed.  Constitutional:      General: He  is not in acute distress.    Appearance: Normal appearance. He is well-developed. He is not ill-appearing or toxic-appearing.  HENT:     Head: Normocephalic.  Eyes:     General: No scleral icterus. Neck:     Vascular: No JVD.  Cardiovascular:     Rate and Rhythm: Normal rate and regular rhythm.     Pulses:          Radial pulses are 2+ on the right side and 2+ on the left side.     Heart sounds: Normal heart sounds. No murmur heard. Pulmonary:     Effort: Pulmonary effort is normal. No respiratory distress.     Breath sounds: Normal breath sounds.  Chest:     Chest wall: No tenderness.  Abdominal:     General: Bowel sounds are normal.     Palpations: Abdomen is soft.  Musculoskeletal:     Right lower leg: No edema.     Left lower leg: No edema.  Skin:    General: Skin is warm and dry.     Capillary Refill: Capillary refill takes less than 2 seconds.  Neurological:     General: No focal deficit present.     Mental Status: He is alert  and oriented to person, place, and time.  Psychiatric:        Mood and Affect: Mood normal.        Behavior: Behavior normal.     ED Results / Procedures / Treatments   Labs (all labs ordered are listed, but only abnormal results are displayed) Labs Reviewed  CBC  COMPREHENSIVE METABOLIC PANEL  LIPASE, BLOOD  TROPONIN I (HIGH SENSITIVITY)  TROPONIN I (HIGH SENSITIVITY)    EKG None  Radiology DG Chest Port 1 View  Result Date: 01/31/2023 CLINICAL DATA:  Chest pain EXAM: PORTABLE CHEST 1 VIEW COMPARISON:  09/10/2022 FINDINGS: The heart size and mediastinal contours are within normal limits. Both lungs are clear. The visualized skeletal structures are unremarkable. IMPRESSION: No active disease. Electronically Signed   By: Ernie Avena M.D.   On: 01/31/2023 13:43    Procedures Procedures   Medications Ordered in ED Medications - No data to display  ED Course/ Medical Decision Making/ A&P           HEART Score: 0                   PERC Score: 0, PERC Score Interpretation: No need for further workup, as <2% chance of PE.  If no criteria are positive and clinicians pre-test probability is <15%, PERC Rule criteria are satisfied Medical Decision Making Amount and/or Complexity of Data Reviewed Labs: ordered. Radiology: ordered.  Initial Impression and Ddx 42 year old male who presents to the emergency department with chest pain Patient PMH that increases complexity of ED encounter: Seizures Differential: Acute chest syndrome, stable angina, atypical angina, pulmonary embolism, pneumothorax, aortic dissection, pleural effusion, CHF, COPD, asthma, myocarditis, pericarditis, cardiac tamponade, chest wall pain   Interpretation of Diagnostics I independent reviewed and interpreted the labs as followed: troponin x 2 negative, CBC, CMP and lipase are all within normal limits  - I independently visualized the following imaging with scope of interpretation limited to determining acute life threatening conditions related to emergency care: Chest x-ray, which revealed no acute findings  Patient Reassessment and Ultimate Disposition/Management 42 year old male who presents to the emergency department with chest pain.  On exam, overall well-appearing, nontoxic in appearance.  He is hemodynamically stable.  Heart and lung sounds are within normal limits on exam.  Will obtain ACS workup.  EKG without ischemia or infarction, troponin x2 negative, doubt ACS  Does not appear fluid volume overloaded on exam, no edema on CXR, doubt CHF exacerbation  Considered but doubt PE. PERC 0, Wells low risk so will defer d-dimer or CTA PE study at this time. CXR without evidence of pneumonia, pleural effusion or pneumothorax. No recent illnesses and troponin negative so doubt pericarditis or myocarditis  Symptoms inconsistent with aortic dissection, he has lack of neurosymptoms, no radiation of pain to his back or abdomen, troponin is  negative. HEART Score: 0    Unclear etiology of his chest pain.  He is currently asymptomatic and feels improved.  He does have primary care which I have encouraged him to follow-up with.  Reassuring workup here.  Could be stress related versus other.  Low suspicion for cardiopulmonary cause of his symptoms.  Given return precautions.  He verbalized understanding.  The patient has been appropriately medically screened and/or stabilized in the ED. I have low suspicion for any other emergent medical condition which would require further screening, evaluation or treatment in the ED or require inpatient management. At time of discharge the patient is hemodynamically stable and  in no acute distress. I have discussed work-up results and diagnosis with patient and answered all questions. Patient is agreeable with discharge plan. We discussed strict return precautions for returning to the emergency department and they verbalized understanding.     Patient management required discussion with the following services or consulting groups:  None  Complexity of Problems Addressed Acute complicated illness or Injury  Additional Data Reviewed and Analyzed Further history obtained from: Past medical history and medications listed in the EMR and Care Everywhere  Patient Encounter Risk Assessment None  Final Clinical Impression(s) / ED Diagnoses Final diagnoses:  Atypical chest pain    Rx / DC Orders ED Discharge Orders     None         Cristopher Peru, PA-C 01/31/23 1549    Benjiman Core, MD 02/01/23 [phone]

## 2023-01-31 NOTE — Discharge Instructions (Signed)
You were seen in the emergency department today for chest pain.  Your labs, x-ray and EKG was all reassuring.  Please follow-up with your primary care provider.  Please return to the emergency department for any worsening symptoms.

## 2023-07-01 ENCOUNTER — Ambulatory Visit
Admission: RE | Admit: 2023-07-01 | Discharge: 2023-07-01 | Disposition: A | Discharge status: Home / Self Care | Payer: BC Managed Care – PPO | Source: Ambulatory Visit | Attending: Family Medicine | Admitting: Family Medicine

## 2023-07-01 VITALS — BP 145/98 | HR 56 | Temp 98.4°F | Resp 18

## 2023-07-01 DIAGNOSIS — H66002 Acute suppurative otitis media without spontaneous rupture of ear drum, left ear: Secondary | ICD-10-CM | POA: Diagnosis not present

## 2023-07-01 DIAGNOSIS — J0100 Acute maxillary sinusitis, unspecified: Secondary | ICD-10-CM

## 2023-07-01 MED ORDER — FLUTICASONE PROPIONATE 50 MCG/ACT NA SUSP
1.0000 | Freq: Every day | NASAL | 0 refills | Status: AC
Start: 2023-07-01 — End: ?

## 2023-07-01 MED ORDER — AMOXICILLIN-POT CLAVULANATE 875-125 MG PO TABS
1.0000 | ORAL_TABLET | Freq: Two times a day (BID) | ORAL | 0 refills | Status: AC
Start: 2023-07-01 — End: ?

## 2023-07-01 NOTE — ED Provider Notes (Signed)
 [facility]    CSN: [id number] Arrival date & time: 07/01/23  0845      History   Chief Complaint Chief Complaint  Patient presents with   Ear Fullness    Ear pain, cough, congestion, sinus pressure. - Entered by patient    HPI Todd James is a 43 y.o. male  presents for evaluation of URI symptoms for 7 days. Patient reports associated symptoms of sinus pressure/pain with cough, left ear pain, sore throat, postnasal drip. Denies N/V/D, fevers, body aches, shortness of breath. Patient does not have a hx of asthma. Patient currently vapes.  Reports  sick contacts via children.  Pt has taken cold medicine OTC for symptoms. Pt has no other concerns at this time.    Ear Fullness    Past Medical History:  Diagnosis Date   Seizures (HCC)     There are no active problems to display for this patient.   Past Surgical History:  Procedure Laterality Date   CHOLECYSTECTOMY     TONSILLECTOMY         Home Medications    Prior to Admission medications   Medication Sig Start Date End Date Taking? Authorizing Provider  amoxicillin -clavulanate (AUGMENTIN ) 875-125 MG tablet Take 1 tablet by mouth every 12 (twelve) hours. 07/01/23  Yes Alexes Menchaca, Jodi R, NP  fluticasone  (FLONASE ) 50 MCG/ACT nasal spray Place 1 spray into both nostrils daily. 07/01/23  Yes Kishon Garriga, Jodi R, NP  benzonatate  (TESSALON ) 200 MG capsule Take 1 capsule (200 mg total) by mouth 3 (three) times daily as needed for cough. 09/10/22   Pacer Dorn, Jodi R, NP  topiramate (TOPAMAX) 50 MG tablet Take by mouth. 03/25/19 04/18/21  [provider]    Family History History reviewed. No pertinent family history.  Social History Social History   Tobacco Use   Smoking status: Some Days   Smokeless tobacco: Never  Vaping Use   Vaping status: Every Day  Substance Use Topics   Alcohol use: Yes   Drug use: Never     Allergies   Levetiracetam   Review of Systems Review of Systems  HENT:  Positive for  congestion, ear pain, postnasal drip and sore throat.   Respiratory:  Positive for cough.      Physical Exam Triage Vital Signs ED Triage Vitals  Encounter Vitals Group     BP 07/01/23 0902 (!) 145/98     Systolic BP Percentile --      Diastolic BP Percentile --      Pulse Rate 07/01/23 0900 (!) 56     Resp 07/01/23 0900 18     Temp 07/01/23 0900 98.4 F (36.9 C)     Temp Source 07/01/23 0900 Oral     SpO2 07/01/23 0900 99 %     Weight --      Height --      Head Circumference --      Peak Flow --      Pain Score 07/01/23 0859 5     Pain Loc --      Pain Education --      Exclude from Growth Chart --    No data found.  Updated Vital Signs BP (!) 145/98   Pulse (!) 56   Temp 98.4 F (36.9 C) (Oral)   Resp 18   SpO2 99%   Visual Acuity Right Eye Distance:   Left Eye Distance:   Bilateral Distance:    Right Eye Near:   Left Eye Near:  Bilateral Near:     Physical Exam Vitals and nursing note reviewed.  Constitutional:      General: He is not in acute distress.    Appearance: Normal appearance. He is not ill-appearing or toxic-appearing.  HENT:     Head: Normocephalic and atraumatic.     Right Ear: Tympanic membrane and ear canal normal.     Left Ear: Ear canal normal. Tympanic membrane is erythematous.     Nose: Congestion present.     Right Turbinates: Swollen.     Left Turbinates: Swollen.     Right Sinus: Maxillary sinus tenderness present.     Left Sinus: Maxillary sinus tenderness present.     Mouth/Throat:     Mouth: Mucous membranes are moist.     Pharynx: Posterior oropharyngeal erythema present.  Eyes:     Pupils: Pupils are equal, round, and reactive to light.  Cardiovascular:     Rate and Rhythm: Normal rate and regular rhythm.     Heart sounds: Normal heart sounds.  Pulmonary:     Effort: Pulmonary effort is normal.     Breath sounds: Normal breath sounds.  Musculoskeletal:     Cervical back: Normal range of motion and neck supple.   Lymphadenopathy:     Cervical: No cervical adenopathy.  Skin:    General: Skin is warm and dry.  Neurological:     General: No focal deficit present.     Mental Status: He is alert and oriented to person, place, and time.  Psychiatric:        Mood and Affect: Mood normal.        Behavior: Behavior normal.      UC Treatments / Results  Labs (all labs ordered are listed, but only abnormal results are displayed) Labs Reviewed - No data to display  EKG   Radiology No results found.  Procedures Procedures (including critical care time)  Medications Ordered in UC Medications - No data to display  Initial Impression / Assessment and Plan / UC Course  I have reviewed the triage vital signs and the nursing notes.  Pertinent labs & imaging results that were available during my care of the patient were reviewed by me and considered in my medical decision making (see chart for details).     Reviewed exam and symptoms with patient.  No red flags.  Start Augmentin  and Flonase .  Nasal rinses as tolerated.  May continue OTC cough medicine as needed.  PCP follow-up 2 to 3 days for recheck.  ER precautions reviewed. Final Clinical Impressions(s) / UC Diagnoses   Final diagnoses:  Acute maxillary sinusitis, recurrence not specified  Non-recurrent acute suppurative otitis media of left ear without spontaneous rupture of tympanic membrane     Discharge Instructions      Start Augmentin  twice daily for 7 days.  You may use Flonase  daily.  Continue over-the-counter cough medicine as needed.  Nasal rinses as tolerated.  Lots of rest and fluids.  Please follow-up with your PCP in 2 to 3 days for recheck.  Please go to the ER for any worsening symptoms.  I hope you feel better soon!   ED Prescriptions     Medication Sig Dispense Auth. Provider   amoxicillin -clavulanate (AUGMENTIN ) 875-125 MG tablet Take 1 tablet by mouth every 12 (twelve) hours. 14 tablet Laporsha Grealish, Jodi R, NP    fluticasone  (FLONASE ) 50 MCG/ACT nasal spray Place 1 spray into both nostrils daily. 15.8 mL Sarath Privott, Jodi R, NP  PDMP not reviewed this encounter.   Loreda Myla SAUNDERS, NP 07/01/23 [phone]

## 2023-07-01 NOTE — ED Triage Notes (Signed)
 Pt presents with c/o cough,lt  ear pain and congestion X 1 wk.    Denies fever   Home interventions: Dayquil

## 2023-07-01 NOTE — Discharge Instructions (Signed)
 Start Augmentin  twice daily for 7 days.  You may use Flonase  daily.  Continue over-the-counter cough medicine as needed.  Nasal rinses as tolerated.  Lots of rest and fluids.  Please follow-up with your PCP in 2 to 3 days for recheck.  Please go to the ER for any worsening symptoms.  I hope you feel better soon!
